# Patient Record
Sex: Male | Born: 1995 | Race: Black or African American | Hispanic: No | Marital: Married | Smoking: Never smoker
Health system: Southern US, Community
[De-identification: ages and names within clinical notes are randomized; demographics above are authoritative.]

---
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
## 2019-12-20 ENCOUNTER — Other Ambulatory Visit: Payer: Self-pay

## 2019-12-20 ENCOUNTER — Ambulatory Visit (HOSPITAL_COMMUNITY)
Admission: EM | Admit: 2019-12-20 | Discharge: 2019-12-20 | Disposition: A | Discharge status: Home / Self Care | Payer: BC Managed Care – PPO | Attending: Family Medicine | Admitting: Family Medicine

## 2019-12-20 ENCOUNTER — Encounter (HOSPITAL_COMMUNITY): Payer: Self-pay

## 2019-12-20 DIAGNOSIS — E65 Localized adiposity: Secondary | ICD-10-CM | POA: Insufficient documentation

## 2019-12-20 LAB — COMPREHENSIVE METABOLIC PANEL
ALT: 51 U/L — ABNORMAL HIGH (ref 0–44)
AST: 33 U/L (ref 15–41)
Albumin: 4.3 g/dL (ref 3.5–5.0)
Alkaline Phosphatase: 72 U/L (ref 38–126)
Anion gap: 8 (ref 5–15)
BUN: 7 mg/dL (ref 6–20)
CO2: 24 mmol/L (ref 22–32)
Calcium: 9.6 mg/dL (ref 8.9–10.3)
Chloride: 106 mmol/L (ref 98–111)
Creatinine, Ser: 0.99 mg/dL (ref 0.61–1.24)
GFR calc Af Amer: >60 mL/min (ref >60)
GFR calc non Af Amer: >60 mL/min (ref >60)
Glucose, Bld: 108 mg/dL — ABNORMAL HIGH (ref 70–99)
Potassium: 4 mmol/L (ref 3.5–5.1)
Sodium: 138 mmol/L (ref 135–145)
Total Bilirubin: 0.5 mg/dL (ref 0.3–1.2)
Total Protein: 7.4 g/dL (ref 6.5–8.1)

## 2019-12-20 LAB — CBC WITH DIFFERENTIAL/PLATELET
Abs Immature Granulocytes: 0 10*3/uL (ref 0.00–0.07)
Basophils Absolute: 0 10*3/uL (ref 0.0–0.1)
Basophils Relative: 1 %
Eosinophils Absolute: 0.1 10*3/uL (ref 0.0–0.5)
Eosinophils Relative: 2 %
HCT: 48.5 % (ref 39.0–52.0)
Hemoglobin: 15.5 g/dL (ref 13.0–17.0)
Immature Granulocytes: 0 %
Lymphocytes Relative: 39 %
Lymphs Abs: 1.8 10*3/uL (ref 0.7–4.0)
MCH: 26.8 pg (ref 26.0–34.0)
MCHC: 32 g/dL (ref 30.0–36.0)
MCV: 83.9 fL (ref 80.0–100.0)
Monocytes Absolute: 0.4 10*3/uL (ref 0.1–1.0)
Monocytes Relative: 8 %
Neutro Abs: 2.3 10*3/uL (ref 1.7–7.7)
Neutrophils Relative %: 50 %
Platelets: 291 10*3/uL (ref 150–400)
RBC: 5.78 MIL/uL (ref 4.22–5.81)
RDW: 11.7 % (ref 11.5–15.5)
WBC: 4.7 10*3/uL (ref 4.0–10.5)
nRBC: 0 % (ref 0.0–0.2)

## 2019-12-20 LAB — CORTISOL: Cortisol, Plasma: 5.5 ug/dL

## 2019-12-20 LAB — TSH: TSH: 1.389 u[IU]/mL (ref 0.350–4.500)

## 2019-12-20 LAB — T4, FREE: Free T4: 0.7 ng/dL (ref 0.61–1.12)

## 2019-12-20 LAB — POCT URINALYSIS DIP (DEVICE)
Bilirubin Urine: NEGATIVE
Glucose, UA: NEGATIVE mg/dL
Hgb urine dipstick: NEGATIVE
Ketones, ur: NEGATIVE mg/dL
Leukocytes,Ua: NEGATIVE
Nitrite: NEGATIVE
Protein, ur: NEGATIVE mg/dL
Specific Gravity, Urine: >=1.03 (ref 1.005–1.030)
Urobilinogen, UA: 0.2 mg/dL (ref 0.0–1.0)
pH: 5.5 (ref 5.0–8.0)

## 2019-12-20 NOTE — Discharge Instructions (Addendum)
I would advise you to set up primary care, so that you may be established and receive care with specialist if needed.  Your urine was not concerning for infection today.  We have drawn blood to check your thyroid and some other labs, we will be in touch with you if these labs require treatment, or related to the lump on the back of her neck.  Follow-up with primary care as needed.

## 2019-12-20 NOTE — ED Triage Notes (Signed)
C/o cyst on back he originally noticed six months ago. Denies any pain.

## 2019-12-20 NOTE — ED Provider Notes (Signed)
[facility]    CSN: [id number] Arrival date & time: 12/20/19  1234      History   Chief Complaint Chief Complaint  Patient presents with  . Cyst    HPI Frederick Short is a 24 y.o. male.   Patient reports that he has a "cyst" on the back of his neck.  Reports has been there for 6 months or more.  Reports that he has had it looked at in [place] but not here.  No attempt to treat at home.  Denies pain, drainage, cysts, new movement.  Denies headache, cough, shortness of breath, nausea, vomiting, diarrhea, rash, fever, other symptoms.  ROS per HPI  The history is provided by the patient.    History reviewed. No pertinent past medical history.  There are no problems to display for this patient.   History reviewed. No pertinent surgical history.     Home Medications    Prior to Admission medications   Medication Sig Start Date End Date Taking? Authorizing Provider  clindamycin (CLEOCIN) 300 MG capsule Take 1 capsule (300 mg total) by mouth 3 (three) times daily. 02/02/15   Charm Rings, MD    Family History No family history on file.  Social History Social History   Tobacco Use  . Smoking status: Never Smoker  . Smokeless tobacco: Never Used  Substance Use Topics  . Alcohol use: Yes    Comment: occasionally  . Drug use: Not on file     Allergies   Patient has no known allergies.   Review of Systems Review of Systems   Physical Exam Triage Vital Signs ED Triage Vitals  Enc Vitals Group     BP 12/20/19 1314 (!) 152/91     Pulse Rate 12/20/19 1314 70     Resp 12/20/19 1314 16     Temp 12/20/19 1314 98.5 F (36.9 C)     Temp Source 12/20/19 1314 Oral     SpO2 12/20/19 1314 97 %     Weight --      Height --      Head Circumference --      Peak Flow --      Pain Score 12/20/19 1313 0     Pain Loc --      Pain Edu? --      Excl. in GC? --    No data found.  Updated Vital Signs BP (!) 152/91 (BP Location: Right Arm)   Pulse 70    Temp 98.5 F (36.9 C) (Oral)   Resp 16   SpO2 97%   Visual Acuity Right Eye Distance:   Left Eye Distance:   Bilateral Distance:    Right Eye Near:   Left Eye Near:    Bilateral Near:     Physical Exam Vitals and nursing note reviewed.  Constitutional:      General: He is not in acute distress.    Appearance: Normal appearance. He is well-developed and normal weight. He is not ill-appearing.  HENT:     Head: Normocephalic and atraumatic.     Right Ear: Tympanic membrane normal.     Left Ear: Tympanic membrane normal.     Nose: Nose normal.     Mouth/Throat:     Mouth: Mucous membranes are moist.     Pharynx: Oropharynx is clear.  Eyes:     Extraocular Movements: Extraocular movements intact.     Conjunctiva/sclera: Conjunctivae normal.     Pupils: Pupils are equal,  round, and reactive to light.  Cardiovascular:     Rate and Rhythm: Normal rate and regular rhythm.     Heart sounds: Normal heart sounds. No murmur.  Pulmonary:     Effort: Pulmonary effort is normal. No respiratory distress.     Breath sounds: Normal breath sounds. No stridor. No wheezing, rhonchi or rales.  Chest:     Chest wall: No tenderness.  Abdominal:     General: Bowel sounds are normal.     Palpations: Abdomen is soft.     Tenderness: There is no abdominal tenderness.  Musculoskeletal:        General: Swelling and deformity present. Normal range of motion.     Cervical back: Normal range of motion and neck supple.     Comments: Buffalo hump noted to patient's back just below cervical region thoracic spine.  Area is about 5 x 5 cm in diameter.  Nontender to palpation.  Almost feels as though it is fluid-filled.  No palpable nodes or knots.  Skin:    General: Skin is warm and dry.     Capillary Refill: Capillary refill takes less than 2 seconds.  Neurological:     General: No focal deficit present.     Mental Status: He is alert and oriented to person, place, and time.  Psychiatric:         Mood and Affect: Mood normal.        Behavior: Behavior normal.        Thought Content: Thought content normal.      UC Treatments / Results  Labs (all labs ordered are listed, but only abnormal results are displayed) Labs Reviewed  COMPREHENSIVE METABOLIC PANEL - Abnormal; Notable for the following components:      Result Value   Glucose, Bld 108 (*)    ALT 51 (*)    All other components within normal limits  URINE CULTURE  CBC WITH DIFFERENTIAL/PLATELET  TSH  T4, FREE  CORTISOL  POCT URINALYSIS DIP (DEVICE)    EKG   Radiology No results found.  Procedures Procedures (including critical care time)  Medications Ordered in UC Medications - No data to display  Initial Impression / Assessment and Plan / UC Course  I have reviewed the triage vital signs and the nursing notes.  Pertinent labs & imaging results that were available during my care of the patient were reviewed by me and considered in my medical decision making (see chart for details).     Buffalo hump: Presenting with before home for last 6 months.  Nontender to palpation.  5 x 5 cm in diameter.  Appears to be fluid-filled, no palpable nodes, knots.  Does not interfere with range of motion.  Unsure of etiology.  UA today negative for infection.  Will check CBC, CMP, thyroid, cortisol level.  Suspicious of Cushing's disease, instructed patient to establish primary care for further work-up.  Patient verbalized understanding agrees to treatment. Final Clinical Impressions(s) / UC Diagnoses   Final diagnoses:  Buffalo hump     Discharge Instructions     I would advise you to set up primary care, so that you may be established and receive care with specialist if needed.  Your urine was not concerning for infection today.  We have drawn blood to check your thyroid and some other labs, we will be in touch with you if these labs require treatment, or related to the lump on the back of her neck.  Follow-up  with  primary care as needed.    ED Prescriptions    None     PDMP not reviewed this encounter.   Faustino Congress, NP 12/22/19 1655

## 2019-12-21 LAB — URINE CULTURE: Culture: NO GROWTH
